# Patient Record
Sex: Male | Born: 1945 | Race: White | Hispanic: No | Marital: Married | Smoking: Never smoker
Health system: Southern US, Community
[De-identification: ages and names within clinical notes are randomized; demographics above are authoritative.]

---

## 2017-11-22 ENCOUNTER — Emergency Department (HOSPITAL_COMMUNITY)
Admission: EM | Admit: 2017-11-22 | Discharge: 2017-11-22 | Disposition: A | Discharge status: Home / Self Care | Payer: Medicare Other | Attending: Emergency Medicine | Admitting: Emergency Medicine

## 2017-11-22 ENCOUNTER — Emergency Department (HOSPITAL_BASED_OUTPATIENT_CLINIC_OR_DEPARTMENT_OTHER): Payer: Medicare Other

## 2017-11-22 ENCOUNTER — Emergency Department (HOSPITAL_COMMUNITY): Payer: Medicare Other

## 2017-11-22 DIAGNOSIS — I351 Nonrheumatic aortic (valve) insufficiency: Secondary | ICD-10-CM

## 2017-11-22 DIAGNOSIS — R109 Unspecified abdominal pain: Secondary | ICD-10-CM | POA: Diagnosis present

## 2017-11-22 DIAGNOSIS — R110 Nausea: Secondary | ICD-10-CM

## 2017-11-22 DIAGNOSIS — R935 Abnormal findings on diagnostic imaging of other abdominal regions, including retroperitoneum: Secondary | ICD-10-CM | POA: Insufficient documentation

## 2017-11-22 DIAGNOSIS — R1084 Generalized abdominal pain: Secondary | ICD-10-CM

## 2017-11-22 DIAGNOSIS — R1011 Right upper quadrant pain: Secondary | ICD-10-CM | POA: Diagnosis not present

## 2017-11-22 LAB — COMPREHENSIVE METABOLIC PANEL
ALT: 27 U/L (ref 0–44)
ANION GAP: 11 (ref 5–15)
AST: 23 U/L (ref 15–41)
Albumin: 4 g/dL (ref 3.5–5.0)
Alkaline Phosphatase: 58 U/L (ref 38–126)
BILIRUBIN TOTAL: 1 mg/dL (ref 0.3–1.2)
BUN: 17 mg/dL (ref 8–23)
CO2: 25 mmol/L (ref 22–32)
Calcium: 9.1 mg/dL (ref 8.9–10.3)
Chloride: 99 mmol/L (ref 98–111)
Creatinine, Ser: 1.26 mg/dL — ABNORMAL HIGH (ref 0.61–1.24)
GFR calc Af Amer: >60 mL/min (ref >60)
GFR, EST NON AFRICAN AMERICAN: 56 mL/min — AB (ref >60)
Glucose, Bld: 113 mg/dL — ABNORMAL HIGH (ref 70–99)
POTASSIUM: 3.9 mmol/L (ref 3.5–5.1)
Sodium: 135 mmol/L (ref 135–145)
TOTAL PROTEIN: 6.4 g/dL — AB (ref 6.5–8.1)

## 2017-11-22 LAB — CBC WITH DIFFERENTIAL/PLATELET
Abs Immature Granulocytes: 0.1 10*3/uL (ref 0.0–0.1)
BASOS PCT: 1 %
Basophils Absolute: 0 10*3/uL (ref 0.0–0.1)
EOS ABS: 0.1 10*3/uL (ref 0.0–0.7)
EOS PCT: 1 %
HEMATOCRIT: 49.3 % (ref 39.0–52.0)
Hemoglobin: 16.3 g/dL (ref 13.0–17.0)
Immature Granulocytes: 1 %
LYMPHS ABS: 0.8 10*3/uL (ref 0.7–4.0)
Lymphocytes Relative: 13 %
MCH: 30.4 pg (ref 26.0–34.0)
MCHC: 33.1 g/dL (ref 30.0–36.0)
MCV: 91.8 fL (ref 78.0–100.0)
MONOS PCT: 8 %
Monocytes Absolute: 0.5 10*3/uL (ref 0.1–1.0)
Neutro Abs: 4.8 10*3/uL (ref 1.7–7.7)
Neutrophils Relative %: 76 %
PLATELETS: 143 10*3/uL — AB (ref 150–400)
RBC: 5.37 MIL/uL (ref 4.22–5.81)
RDW: 13 % (ref 11.5–15.5)
WBC: 6.2 10*3/uL (ref 4.0–10.5)

## 2017-11-22 LAB — ABO/RH: ABO/RH(D): O POS

## 2017-11-22 LAB — URINALYSIS, ROUTINE W REFLEX MICROSCOPIC
Bilirubin Urine: NEGATIVE
GLUCOSE, UA: NEGATIVE mg/dL
HGB URINE DIPSTICK: NEGATIVE
Ketones, ur: 5 mg/dL — AB
Leukocytes, UA: NEGATIVE
Nitrite: NEGATIVE
PH: 6 (ref 5.0–8.0)
Protein, ur: NEGATIVE mg/dL
Specific Gravity, Urine: >1.046 — ABNORMAL HIGH (ref 1.005–1.030)

## 2017-11-22 LAB — TYPE AND SCREEN
ABO/RH(D): O POS
ANTIBODY SCREEN: NEGATIVE

## 2017-11-22 LAB — ECHOCARDIOGRAM COMPLETE
Height: 69 in
Weight: 2528 oz

## 2017-11-22 LAB — LIPASE, BLOOD: LIPASE: 40 U/L (ref 11–51)

## 2017-11-22 LAB — I-STAT CG4 LACTIC ACID, ED: Lactic Acid, Venous: 1.08 mmol/L (ref 0.5–1.9)

## 2017-11-22 MED ORDER — ASPIRIN 81 MG PO CHEW: 81.0000 mg | CHEWABLE_TABLET | Freq: Every day | ORAL | 0 refills | Status: AC

## 2017-11-22 MED ORDER — ASPIRIN 325 MG PO TABS
325.0000 mg | ORAL_TABLET | Freq: Every day | ORAL | Status: DC
  Administered 2017-11-22: 325 mg via ORAL
  Filled 2017-11-22: qty 1

## 2017-11-22 MED ORDER — PANTOPRAZOLE SODIUM 20 MG PO TBEC: 20.0000 mg | DELAYED_RELEASE_TABLET | Freq: Every day | ORAL | 0 refills | Status: DC

## 2017-11-22 MED ORDER — SODIUM CHLORIDE 0.9 % IV BOLUS: 1000.0000 mL | Freq: Once | INTRAVENOUS | Status: DC

## 2017-11-22 MED ORDER — MORPHINE SULFATE (PF) 4 MG/ML IV SOLN: 4.0000 mg | Freq: Once | INTRAVENOUS | Status: DC

## 2017-11-22 MED ORDER — ONDANSETRON HCL 4 MG/2ML IJ SOLN
4.0000 mg | Freq: Once | INTRAMUSCULAR | Status: DC
  Filled 2017-11-22: qty 2

## 2017-11-22 MED ORDER — PANTOPRAZOLE SODIUM 40 MG PO TBEC
40.0000 mg | DELAYED_RELEASE_TABLET | Freq: Once | ORAL | Status: AC
  Administered 2017-11-22: 40 mg via ORAL
  Filled 2017-11-22: qty 1

## 2017-11-22 MED ORDER — SODIUM CHLORIDE 0.9 % IV SOLN: Freq: Once | INTRAVENOUS | Status: DC

## 2017-11-22 MED ORDER — SODIUM CHLORIDE 0.9 % IV BOLUS
1000.0000 mL | Freq: Once | INTRAVENOUS | Status: AC
  Administered 2017-11-22: 1000 mL via INTRAVENOUS

## 2017-11-22 MED ORDER — ONDANSETRON HCL 4 MG/2ML IJ SOLN: 4.0000 mg | Freq: Once | INTRAMUSCULAR | Status: DC

## 2017-11-22 MED ORDER — GI COCKTAIL ~~LOC~~
30.0000 mL | Freq: Once | ORAL | Status: AC
  Administered 2017-11-22: 30 mL via ORAL
  Filled 2017-11-22: qty 30

## 2017-11-22 MED ORDER — TRAMADOL HCL 50 MG PO TABS: 50.0000 mg | ORAL_TABLET | Freq: Four times a day (QID) | ORAL | 0 refills | Status: AC | PRN

## 2017-11-22 NOTE — ED Notes (Signed)
ECHO in process

## 2017-11-22 NOTE — ED Provider Notes (Signed)
  Physical Exam  BP (!) 147/77   Pulse (!) 56   Temp 98 F (36.7 C) (Oral)   Resp 15   Ht 5\' 9"  (1.753 m)   Wt 71.7 kg   SpO2 96%   BMI 23.33 kg/m   Physical Exam  ED Course/Procedures     Procedures  MDM  Patient care assumed at 4 pm. Patient sent from [facility] for possible dissection vs emboli in the splenic artery. Dr. Chestine Sporelark from vascular saw patient and reviewed images. He felt it may be an incidental finding. Recommend echo, which was normal. Abdominal US ordered and was unremarkable. Dr. Chestine Sporelark recommend ASA 81 mg daily and follow up in his office. Of note, lactate nl as well and per Dr. Chestine Sporelark, the CTA showed no mesenteric ischemia.   7:15 PM Felt better in the ED. Stable for discharge.        Charlynne PanderYao, Jebediah Macrae Hsienta, MD 11/22/17 [phone]

## 2017-11-22 NOTE — ED Notes (Signed)
Report given to oncoming RN.

## 2017-11-22 NOTE — ED Notes (Signed)
Paged trish regarding echo

## 2017-11-22 NOTE — ED Triage Notes (Signed)
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

## 2017-11-22 NOTE — Discharge Instructions (Signed)
Take ASA 81 mg daily.   Take protonix 20 mg daily.   Take ultram for severe pain.   See Dr. Chestine Sporelark for follow up in several months.   See your primary care doctor  Return to ER if you have worse abdominal pain, vomiting, fever, chest pain.

## 2017-11-22 NOTE — ED Provider Notes (Addendum)
[facility] EMERGENCY DEPARTMENT Provider Note   CSN: [id number] Arrival date & time: 11/22/17  1116   [facility]  History   Chief Complaint Chief Complaint  Patient presents with  . Abdominal Pain    HPI Daniel Howell is a 72 y.o. male.  Level 5 caveat for acuity of condition.  Right-sided abdominal pain since Monday described as dull with associated nausea.  He was [facility] on Tuesday, 11/20/2017 and discharged home.  He returned today with persistent right-sided abdominal pain.  CT abdomen/pelvis showed a possible filling defect in the distal celiac axis with radiation to the proximal splenic artery; question emboli versus dissection.  Patient was [facility] for [facility].  His symptoms were discussed with the vascular surgeon Dr. Chestine Sporelark.     No past medical history on file.  There are no active problems to display for this patient.       Home Medications    Prior to Admission medications   Not on File    Family History No family history on file.  Social History Social History   Tobacco Use  . Smoking status: Not on file  Substance Use Topics  . Alcohol use: Not on file  . Drug use: Not on file     Allergies   Patient has no allergy information on record.   Review of Systems Review of Systems  Unable to perform ROS: Acuity of condition     Physical Exam Updated Vital Signs BP 131/74 (BP Location: Right Arm)   Pulse (!) 53   Temp 98 F (36.7 C) (Oral)   Resp 20   Ht 5\' 9"  (1.753 m)   Wt 71.7 kg   SpO2 97%   BMI 23.33 kg/m   Physical Exam  Constitutional: He is oriented to person, place, and time. He appears well-developed and well-nourished.  nad  HENT:  Head: Normocephalic and atraumatic.  Eyes: Conjunctivae are normal.  Neck: Neck supple.  Cardiovascular: Normal rate and regular rhythm.  Pulmonary/Chest: Effort normal and breath sounds normal.  Abdominal: Soft. Bowel sounds  are normal.  Tender right abdomen  Musculoskeletal: Normal range of motion.  Neurological: He is alert and oriented to person, place, and time.  Skin: Skin is warm and dry.  Psychiatric: He has a normal mood and affect. His behavior is normal.  Nursing note and vitals reviewed.    ED Treatments / Results  Labs (all labs ordered are listed, but only abnormal results are displayed) Labs Reviewed  CBC WITH DIFFERENTIAL/PLATELET  COMPREHENSIVE METABOLIC PANEL  LIPASE, BLOOD    EKG None  Radiology No results found.  Procedures Procedures (including critical care time)  Medications Ordered in ED Medications  0.9 %  sodium chloride infusion (has no administration in time range)  ondansetron (ZOFRAN) injection 4 mg (has no administration in time range)  morphine 4 MG/ML injection 4 mg (has no administration in time range)     Initial Impression / Assessment and Plan / ED Course  I have reviewed the triage vital signs and the nursing notes.  Pertinent labs & imaging results that were available during my care of the patient were reviewed by me and considered in my medical decision making (see chart for details).     Patient presents with right-sided abdominal pain since Monday.  CT scan today obtained in [place] reveals a possible filling defect in the distal celiac axis radiating to the proximal splenic artery.  These findings were  discussed with the vascular surgeon on-call Dr. Chestine Spore.  He will evaluate the patient.   1300: Patient evaluated by vascular surgeon Dr. Willaim Bane.  He did not think his CT scan correlated with his clinical presentation.  He recommended an echocardiogram of the heart to rule out a clot.  Discharge medication aspirin daily.  Repeat CT scan in 3 months.  1500: Patient has persistent right upper quadrant pain.  Will order ultrasound to assess his common bile duct.  1545: Discussed with Dr. Silverio Lay.  Final Clinical Impressions(s) / ED Diagnoses   Final  diagnoses:  Abdominal pain, unspecified abdominal location    ED Discharge Orders    None       Donnetta Hutching, MD 11/22/17 1610    Donnetta Hutching, MD 11/22/17 [phone]

## 2017-11-22 NOTE — ED Notes (Signed)
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

## 2017-11-22 NOTE — Progress Notes (Signed)
  Echocardiogram 2D Echocardiogram has been performed.  Celene SkeenVijay  Ardath Lepak 11/22/2017, 1:42 PM

## 2017-11-22 NOTE — ED Notes (Signed)
Patient verbalizes understanding of discharge instructions. Opportunity for questioning and answers were provided. Armband removed by staff, pt discharged from ED ambulatory.   

## 2017-11-22 NOTE — Consult Note (Signed)
Hospital Consult    Reason for Consult: Concern for embolus to the splenic artery Referring Physician: ED MRN #:  [id number]  History of Present Illness: This is a 72 y.o. male with no perrtinent past medical history that presents as a transfer from [place] for evaluation of possible splenic embolus.  Patient was initially evaluated on Monday when he was having right-sided abdominal pain.  At that time he had a CT abdomen pelvis with no acute findings.  He represented to [place] earlier today with persistent pain on the right side just below his costal margin.  He underwent a CTA abdomen and pelvis to rule out dissection.  On the scan there was concern for a small filling defect in the wall of the proximal splenic artery that may represent thrombus.  He was subsequently transferred to [facility] hospital for further evaluation.  On my evaluation patient states his pain has persisted on the right side.  He states it is better when he sitting still only worse when he is moving.  He denies any associated trauma.  He has no history of clots or embolus.  No history of arrhythmia.  Tolerating PO.  Chews tobacco.  PMH: Denies  PSH: Inguinal hernia repair  Allergies not on file  Prior to Admission medications   Not on File    Social History   Socioeconomic History  . Marital status: Married    Spouse name: Not on file  . Number of children: Not on file  . Years of education: Not on file  . Highest education level: Not on file  Occupational History  . Not on file  Social Needs  . Financial resource strain: Not on file  . Food insecurity:    Worry: Not on file    Inability: Not on file  . Transportation needs:    Medical: Not on file    Non-medical: Not on file  Tobacco Use  . Smoking status: Not on file  Substance and Sexual Activity  . Alcohol use: Not on file  . Drug use: Not on file  . Sexual activity: Not on file  Lifestyle  . Physical activity:    Days per week: Not on file   Minutes per session: Not on file  . Stress: Not on file  Relationships  . Social connections:    Talks on phone: Not on file    Gets together: Not on file    Attends religious service: Not on file    Active member of club or organization: Not on file    Attends meetings of clubs or organizations: Not on file    Relationship status: Not on file  . Intimate partner violence:    Fear of current or ex partner: Not on file    Emotionally abused: Not on file    Physically abused: Not on file    Forced sexual activity: Not on file  Other Topics Concern  . Not on file  Social History Narrative  . Not on file     No family history on file.  ROS: [x]  Positive   [ ]  Negative   [ ]  All sytems reviewed and are negative  Cardiovascular: []  chest pain/pressure []  palpitations []  SOB lying flat []  DOE []  pain in legs while walking []  pain in legs at rest []  pain in legs at night []  non-healing ulcers []  hx of DVT []  swelling in legs  Pulmonary: []  productive cough []  asthma/wheezing []  home O2  Neurologic: []  weakness in []   arms []  legs []  numbness in []  arms []  legs []  hx of CVA []  mini stroke [] difficulty speaking or slurred speech []  temporary loss of vision in one eye []  dizziness  Hematologic: []  hx of cancer []  bleeding problems []  problems with blood clotting easily  Endocrine:   []  diabetes []  thyroid disease  GI []  vomiting blood []  blood in stool  GU: []  CKD/renal failure []  HD--[]  M/W/F or []  T/T/S []  burning with urination []  blood in urine  Psychiatric: []  anxiety []  depression  Musculoskeletal: []  arthritis []  joint pain xRight rib pain  Integumentary: []  rashes []  ulcers  Constitutional: []  fever []  chills   Physical Examination  Vitals:   11/22/17 1127 11/22/17 1136  BP: 134/73 131/74  Pulse: (!) 49 (!) 53  Resp: 12 20  Temp: 98 F (36.7 C) 98 F (36.7 C)  SpO2: 100% 97%   Body mass index is 23.33 kg/m.  General:  NAD,  resting in bed in ED HENT: WNL, normocephalic Pulmonary: normal non-labored breathing Cardiac: regular, without  Murmurs, rubs or gallops Abdomen: soft, no rebound, no guarding.  Focal tenderness under right rib. Skin: warm Vascular Exam/Pulses:  Right Left  Radial 2+ (normal) 2+ (normal)  Ulnar    Femoral 2+ (normal) 2+ (normal)  Popliteal 2+ (normal) 2+ (normal)  DP 2+ (normal) 2+ (normal)  PT 2+ (normal) 2+ (normal)   Extremities: without ischemic changes, without Gangrene , without cellulitis; without open wounds;  Musculoskeletal: no muscle wasting or atrophy  Neurologic: A&O X 3; Appropriate Affect ; SENSATION: normal; MOTOR FUNCTION:  moving all extremities equally. Speech is fluent/normal Psychiatric:  Pleasant   CBC    Component Value Date/Time   WBC 6.2 11/22/2017 1148   RBC 5.37 11/22/2017 1148   HGB 16.3 11/22/2017 1148   HCT 49.3 11/22/2017 1148   PLT 143 (L) 11/22/2017 1148   MCV 91.8 11/22/2017 1148   MCH 30.4 11/22/2017 1148   MCHC 33.1 11/22/2017 1148   RDW 13.0 11/22/2017 1148   LYMPHSABS 0.8 11/22/2017 1148   MONOABS 0.5 11/22/2017 1148   EOSABS 0.1 11/22/2017 1148   BASOSABS 0.0 11/22/2017 1148    BMET No results found for: NA, K, CL, CO2, GLUCOSE, BUN, CREATININE, CALCIUM, GFRNONAA, GFRAA  COAGS: No results found for: INR, PROTIME   Non-Invasive Vascular Imaging:     CTA: On my review there is no evidence of embolic source in the thoracic aorta.  His SMA appears widely patent.  He has a small filling defect in the lateral wall of the distal celiac into the splenic artery.  This does not appear flow-limiting.  The splenic artery is otherwise patent.  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  ASSESSMENT/PLAN:   72 year old male the presents as a transfer from [place] with concern for focal splenic thrombus.  On my review of the CTA he has a very focal lesion in the distal celiac proximal  splenic artery.  I do not think this is consistent with a dissection.  It may be a small filling defect consistent with thrombus and I agree with plan for embolic work-up.  I see no evidence of embolic source in the thoracic aorta.  An echocardiogram would be appropriate to rule out any other source.  From my standpoint he can take an aspirin and follow-up in 3 months with a repeat CTA.    Suspect this is an incidental finding since it really  does not correlate with his symptoms.  At this time he has right subcostal pain better when he sitting still and worse with movement.  He does not look toxic, he has no elevated white count, no tachycardia, and no other concerning findings when I examined him.  His abdomen is very benign.  Cephus Shellinghristopher J. Thorvald Orsino, MD [facility] Office: [phone] Pager: [phone]  Cephus Shellinghristopher J Kianah Harries

## 2017-12-06 ENCOUNTER — Other Ambulatory Visit: Payer: Self-pay

## 2017-12-06 DIAGNOSIS — I728 Aneurysm of other specified arteries: Secondary | ICD-10-CM

## 2018-01-04 ENCOUNTER — Telehealth: Payer: Self-pay | Admitting: Vascular Surgery

## 2018-01-04 NOTE — Telephone Encounter (Signed)
sch appt spk to pt wife mld ltr 02/18/18 CTA abd/pelvis 02/19/18 315pm f/u MD

## 2018-01-04 NOTE — Telephone Encounter (Signed)
-----   Message from Cephus Shelling, MD sent at 11/22/2017  3:10 PM EDT ----- Level 4 consult in ED.  He can follow-up with me in 3 months with repeat CTA.  Thanks,  Thayer Ohm

## 2018-02-18 ENCOUNTER — Ambulatory Visit
Admission: RE | Admit: 2018-02-18 | Discharge: 2018-02-18 | Disposition: A | Discharge status: Home / Self Care | Payer: Medicare Other | Source: Ambulatory Visit | Attending: Vascular Surgery | Admitting: Vascular Surgery

## 2018-02-18 DIAGNOSIS — I728 Aneurysm of other specified arteries: Secondary | ICD-10-CM

## 2018-02-18 IMAGING — CT CT CTA ABD/PEL W/CM AND/OR W/O CM
2 of 9 series · 14 of 46 positions shown, 16 images · IV contrast (iopamidol)
Comparison: [DATE], [DATE]

CLINICAL DATA: 72-year-old male with a history of visceral aneurysm

EXAM:
CTA ABDOMEN AND PELVIS wITHOUT AND WITH CONTRAST
TECHNIQUE: Multidetector CT imaging of the abdomen and pelvis was performed
using the standard protocol during bolus administration of
intravenous contrast. Multiplanar reconstructed images and MIPs were
obtained and reviewed to evaluate the vascular anatomy.
CONTRAST:  75mL [FD] IOPAMIDOL ([FD]) INJECTION 76%

[Series 9: cta arterial 2.00 bv36 s3 axial st · axial · arterial · 0.69mm/px · z∈[+1252,+1650]mm · 11 of 225 slices shown, 13 images]
[im 13/225  soft-tissue]
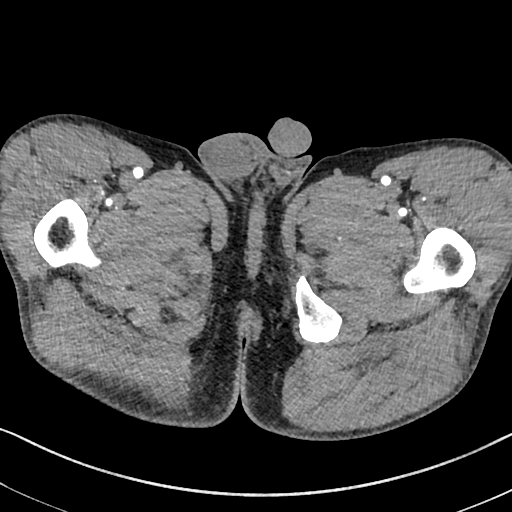
[im 13/225  bone]
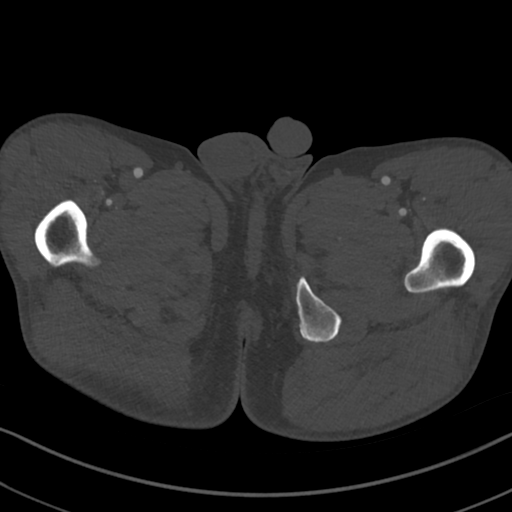
[im 38/225  soft-tissue]
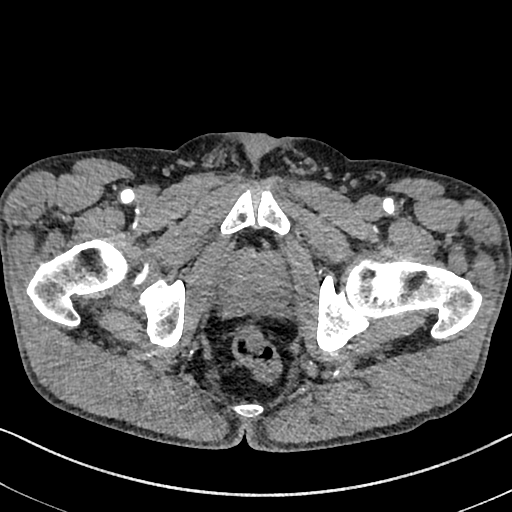
[im 50/225  soft-tissue]
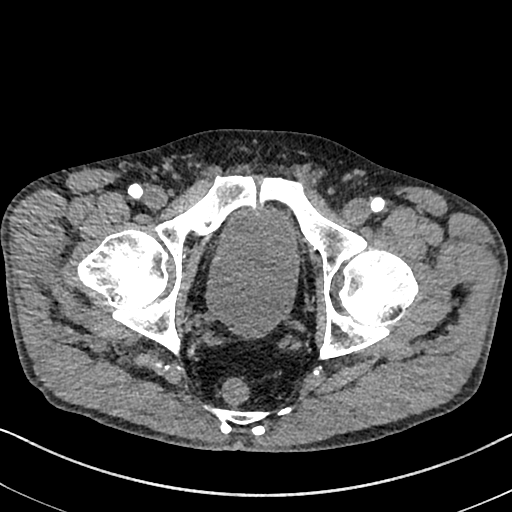
[im 75/225  soft-tissue]
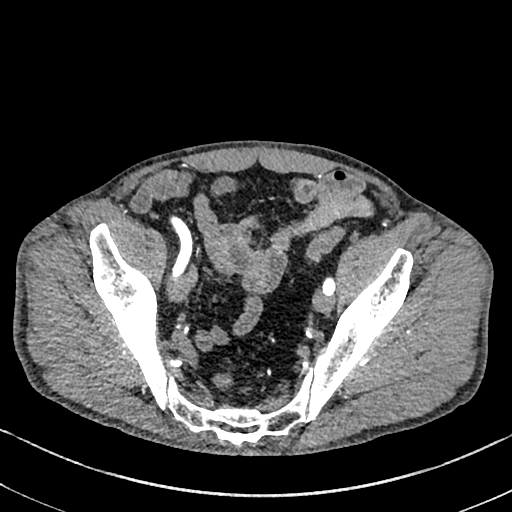
[im 88/225  soft-tissue]
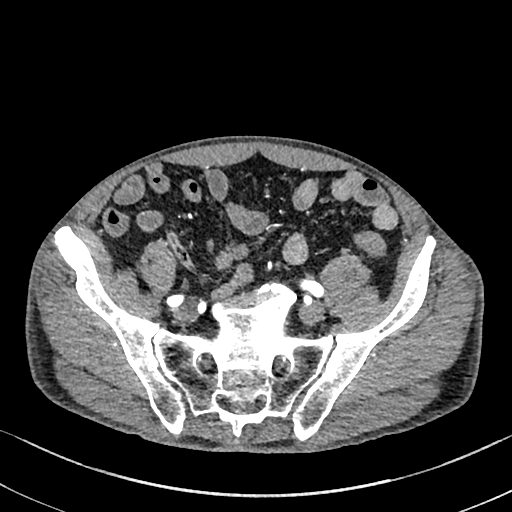
[im 113/225  soft-tissue]
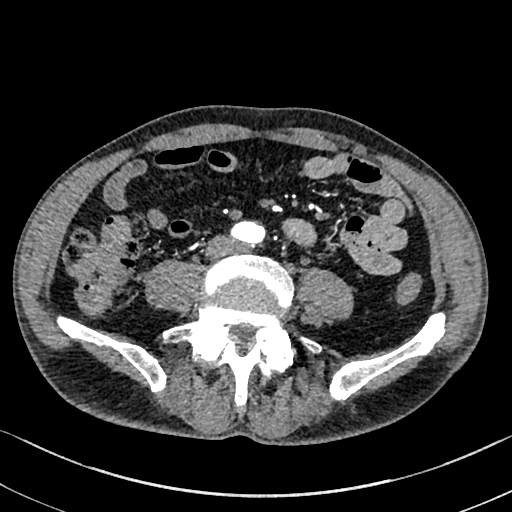
[im 137/225  soft-tissue]
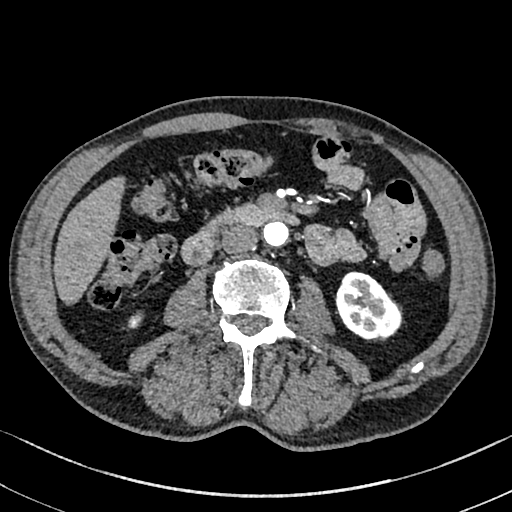
[im 150/225  soft-tissue]
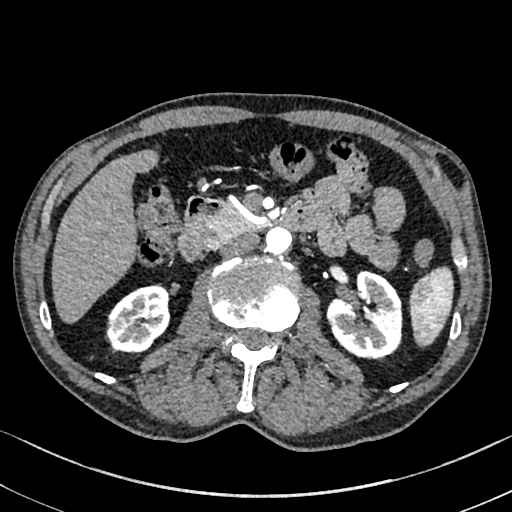
[im 175/225  soft-tissue]
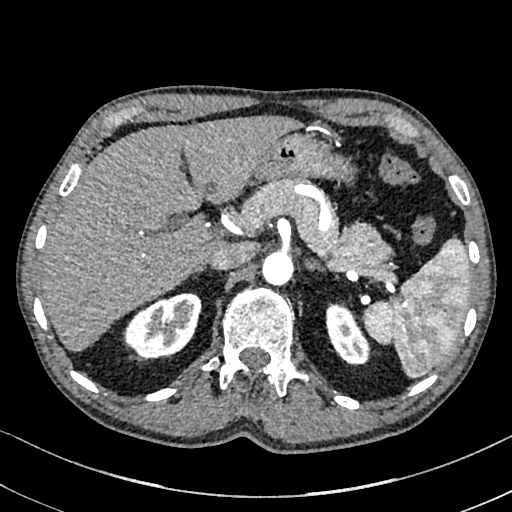
[im 175/225  bone]
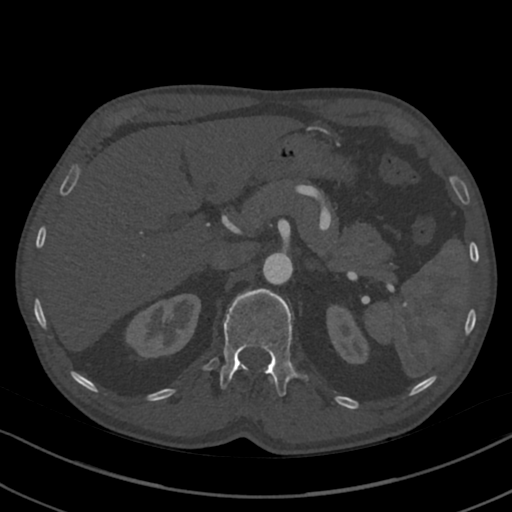
[im 187/225  soft-tissue]
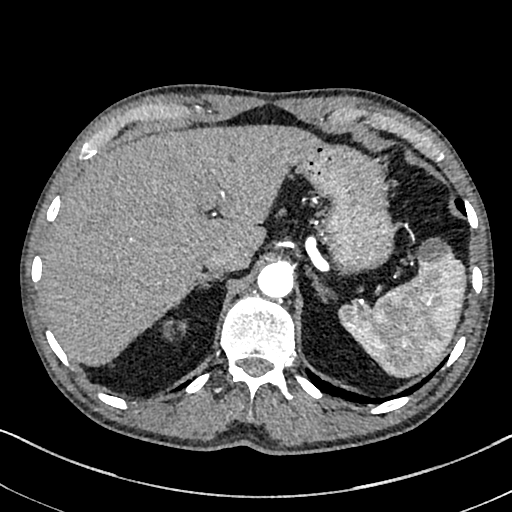
[im 212/225  soft-tissue]
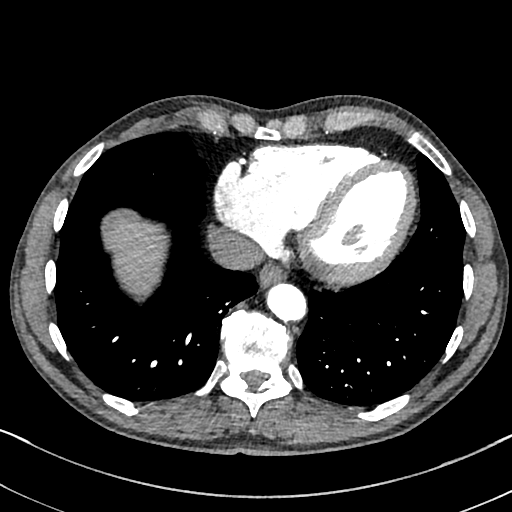

[Series 12: cta arterial 2.00 bv36 s3 cor cor art st · coronal · arterial · 0.69mm/px · 3 of 140 slices shown]
[im 35/140  soft-tissue]
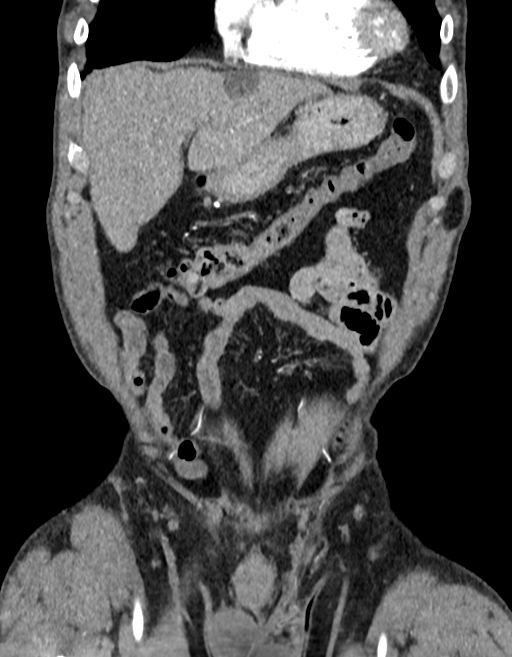
[im 70/140  soft-tissue]
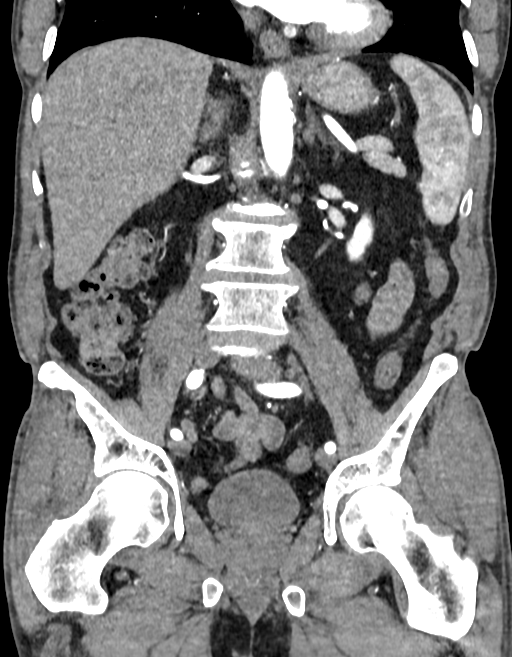
[im 105/140  soft-tissue]
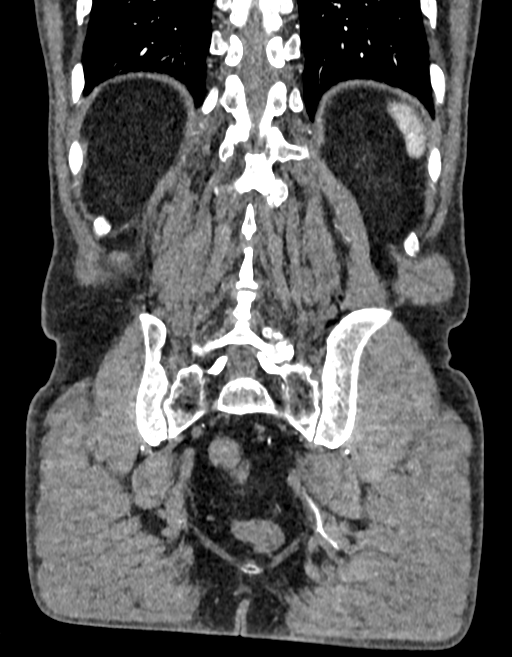

[14 of 46 positions shown; findings below may reference images not displayed]

FINDINGS: VASCULAR

Aorta: Unremarkable course, caliber, contour of the abdominal aorta.
No dissection, aneurysm, or periaortic fluid. Minimal
atherosclerotic changes of the abdominal aorta.

Celiac: Celiac artery demonstrates minimal atherosclerotic changes
at the origin. There is no wall thickening or inflammatory changes.
No stenosis or occlusion. Interval resolution of the prior linear
filling defect, with patency of the branch vessels including common
hepatic artery, splenic artery, left gastric artery, and pancreatic
branch. No aneurysm. Distal splenic artery branches unremarkable.

SMA: Minimal atherosclerotic changes at the origin of the superior
mesenteric artery. No wall thickening, dissection, aneurysm. Mild
plaque in the distal SMA in the region of the arcade origins, with
no significant stenosis.

Renals: Bilateral renal arteries are patent with no significant
atherosclerotic changes.

IMA: IMA is patent.

Right lower extremity:

Mild atherosclerotic changes of the right iliac artery. No aneurysm
or dissection. Hypogastric arteries patent. Mild tortuosity. No
significant atherosclerotic changes of the external iliac artery or
the common femoral artery. Proximal profunda femoris and SFA patent.

Left lower extremity:

Mild atherosclerotic changes of the left iliac artery. No
dissection. No aneurysm. No significant stenosis or occlusion.
Hypogastric artery is patent. No significant atherosclerosis of the
external iliac artery or common femoral artery. Proximal profunda
femoris and SFA patent.

Veins: Unremarkable appearance of the venous system.

Review of the MIP images confirms the above findings.

NON-VASCULAR

Lower chest: No acute.

Hepatobiliary: Low-density lesion of the left liver lobe, segment 2
measures 22 mm, unchanged from prior and compatible with simple
cyst. Unchanged appearance of small cystic lesion of segment 5 near
the gallbladder fossa, likely benign. Cholecystectomy

Pancreas: Unremarkable pancreas.

Spleen: Redemonstration of low-density nonenhancing lesion at the
anterior aspect of the spleen which is unchanged in size measuring
2.8 cm..

Adrenals/Urinary Tract: Unremarkable appearance of adrenal glands.

Right:

No hydronephrosis. Symmetric perfusion to the left. No
nephrolithiasis. Unremarkable course of the right ureter.

Left:

No hydronephrosis. Symmetric perfusion to the right. No
nephrolithiasis. Unremarkable course of the left ureter.

Unremarkable appearance of the urinary bladder .

Stomach/Bowel: Unremarkable appearance of the stomach. Unremarkable
appearance of small bowel. No evidence of obstruction. Colonic
diverticula present. No associated inflammatory changes. Normal
appendix.

Lymphatic: Multiple lymph nodes in the para-aortic nodal station,
none of which are enlarged.

Mesenteric: No free fluid or air. No adenopathy.

Reproductive: Transverse diameter of prostate measures 4.9 cm

Other: No hernia.

Musculoskeletal: Multilevel degenerative changes of the visualized
thoracolumbar spine. Most advanced changes are at the L5-S1 level
with disc space narrowing. Grade 1 anterolisthesis of L4 on L5. No
acute fracture identified. Degenerative changes of the hips.
IMPRESSION: The linear filling defect within the celiac artery and splenic
artery identified on prior CT angiogram is no longer visualized,
representing either resolution in the setting of a transient filling
defect, or potentially a prior artifact. No CT evidence of aneurysm,
dissection, or vasculitis.

Aortic Atherosclerosis ([FD]-[FD]).

Redemonstration of benign cystic lesions of the liver.

Cholecystectomy.

Redemonstration of low-density lesion of the anterior spleen,
potentially an epithelial cyst or changes after prior trauma. MRI
may be considered if further evaluation is warranted.

Diverticular disease without evidence of acute diverticulitis.

## 2018-02-18 MED ORDER — IOPAMIDOL (ISOVUE-370) INJECTION 76%
75.0000 mL | Freq: Once | INTRAVENOUS | Status: AC | PRN
  Administered 2018-02-18: 75 mL via INTRAVENOUS

## 2018-02-19 ENCOUNTER — Encounter: Payer: Self-pay | Admitting: Vascular Surgery

## 2018-02-19 ENCOUNTER — Other Ambulatory Visit: Payer: Self-pay

## 2018-02-19 ENCOUNTER — Ambulatory Visit (INDEPENDENT_AMBULATORY_CARE_PROVIDER_SITE_OTHER): Payer: Medicare Other | Admitting: Vascular Surgery

## 2018-02-19 DIAGNOSIS — I748 Embolism and thrombosis of other arteries: Secondary | ICD-10-CM | POA: Diagnosis not present

## 2018-02-19 NOTE — Progress Notes (Signed)
Patient name: Daniel Howell Schemm MRN: [id number] DOB: 07/27/1945 Sex: male  REASON FOR VISIT: Hospital follow-up   HPI: Daniel Howell Cantero is a 72 y.o. male initially seen in consultation in the hospital for a filling defect in the distal celiac proximal splenic artery that was concerning for focal dissection.  At the time he presented with right-sided abdominal pain and we did not feel the symptoms correlated.  We did recommend embolic work-up that was negative including TTE with no overt source and nothing in his thoracic aorta.  He presents today and states his abdominal pain has totally resolved.  He has had no recurrent issues.  He did get a repeat CT scan that shows no further evidence of this filling defect.  History reviewed. No pertinent past medical history.  History reviewed. No pertinent surgical history.  History reviewed. No pertinent family history.  SOCIAL HISTORY: Social History   Tobacco Use  . Smoking status: Never Smoker  . Smokeless tobacco: Current User    Types: Chew  Substance Use Topics  . Alcohol use: Not Currently    Allergies  Allergen Reactions  . Lortab [Hydrocodone-Acetaminophen] Shortness Of Breath  . Codeine Nausea Only  . Adhesive [Tape] Rash    Current Outpatient Medications  Medication Sig Dispense Refill  . acetaminophen (TYLENOL) 500 MG tablet Take 500 mg by mouth 3 (three) times daily as needed for mild pain.    Marland Kitchen. aspirin 81 MG chewable tablet Chew 1 tablet (81 mg total) by mouth daily. 30 tablet 0  . cholecalciferol (VITAMIN D) 1000 units tablet Take 1,000 Units by mouth daily.    . clotrimazole (LOTRIMIN) 1 % cream Apply 1 application topically 2 (two) times daily as needed (for elbow rash).    . hydrochlorothiazide (HYDRODIURIL) 25 MG tablet Take 12.5 mg by mouth every morning.     . Multiple Vitamins-Minerals (ICAPS AREDS 2) CAPS Take 1 capsule by mouth 2 (two) times daily.    . RABEprazole (ACIPHEX) 20 MG tablet Take 20 mg by mouth  daily before breakfast.    . rosuvastatin (CRESTOR) 10 MG tablet Take 10 mg by mouth daily.    . traMADol (ULTRAM) 50 MG tablet Take 1 tablet (50 mg total) by mouth every 6 (six) hours as needed. 10 tablet 0   No current facility-administered medications for this visit.     REVIEW OF SYSTEMS:  [X]  denotes positive finding, [ ]  denotes negative finding Cardiac  Comments:  Chest pain or chest pressure:    Shortness of breath upon exertion:    Short of breath when lying flat:    Irregular heart rhythm:        Vascular    Pain in calf, thigh, or hip brought on by ambulation:    Pain in feet at night that wakes you up from your sleep:     Blood clot in your veins:    Leg swelling:         Pulmonary    Oxygen at home:    Productive cough:     Wheezing:         Neurologic    Sudden weakness in arms or legs:     Sudden numbness in arms or legs:     Sudden onset of difficulty speaking or slurred speech:    Temporary loss of vision in one eye:     Problems with dizziness:         Gastrointestinal    Blood in stool:  Vomited blood:         Genitourinary    Burning when urinating:     Blood in urine:        Psychiatric    Major depression:         Hematologic    Bleeding problems:    Problems with blood clotting too easily:        Skin    Rashes or ulcers:        Constitutional    Fever or chills:      PHYSICAL EXAM: Vitals:   02/19/18 1456  BP: 133/90  Pulse: 67  Resp: 20  Temp: 97.7 F (36.5 C)  SpO2: 97%  Weight: 158 lb (71.7 kg)  Height: 5\' 9"  (1.753 m)    GENERAL: The patient is a well-nourished male, in no acute distress. The vital signs are documented above. CARDIAC: There is a regular rate and rhythm.  VASCULAR:  2+ femoral pulse palpable both groins 2+ DP/PT palpable bilateral lower extremities PULMONARY: There is good air exchange bilaterally without wheezing or rales. ABDOMEN: Soft and non-tender with normal pitched bowel sounds.  No rebound  or guarding. MUSCULOSKELETAL: There are no major deformities or cyanosis. NEUROLOGIC: No focal weakness or paresthesias are detected.  DATA:   I independently reviewed his repeat CTA and I see no evidence of a filling defect that was previously noted in the distal celiac into the splenic artery.  Assessment/Plan:  Repeat CTA shows no filling defect in the distal celiac proximal splenic artery as previously noted during hospital consultation.  He has also undergone a negative embolic work-up with no evidence in his thoracic aorta or on TTE.  Discussed with the patient that I think these are all reassuring findings and I do not think he needs any further work-up at this time.  In addition the abdominal pain he had has completely resolved.  Offered him as needed follow-up in the future if he has any questions or concerns.  This may have been an incidental artifact on CT initially.   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   Cephus Shelling, MD [facility] Office: [phone] Pager: [phone]   Cephus Shelling
